# Patient Record
Sex: Female | Born: 1945 | Race: Black or African American | Hispanic: No | Marital: Married | Smoking: Never smoker
Health system: Southern US, Community
[De-identification: ages and names within clinical notes are randomized; demographics above are authoritative.]

## PROBLEM LIST (undated history)

## (undated) DIAGNOSIS — E119 Type 2 diabetes mellitus without complications: Secondary | ICD-10-CM

## (undated) DIAGNOSIS — I10 Essential (primary) hypertension: Secondary | ICD-10-CM

---

## 2018-04-29 ENCOUNTER — Emergency Department: Payer: Medicare HMO

## 2018-04-29 ENCOUNTER — Other Ambulatory Visit: Payer: Self-pay

## 2018-04-29 ENCOUNTER — Encounter: Payer: Self-pay | Admitting: Emergency Medicine

## 2018-04-29 ENCOUNTER — Emergency Department
Admission: EM | Admit: 2018-04-29 | Discharge: 2018-04-29 | Disposition: A | Discharge status: Home / Self Care | Payer: Medicare HMO | Attending: Emergency Medicine | Admitting: Emergency Medicine

## 2018-04-29 DIAGNOSIS — E119 Type 2 diabetes mellitus without complications: Secondary | ICD-10-CM | POA: Diagnosis not present

## 2018-04-29 DIAGNOSIS — R42 Dizziness and giddiness: Secondary | ICD-10-CM | POA: Diagnosis present

## 2018-04-29 DIAGNOSIS — I10 Essential (primary) hypertension: Secondary | ICD-10-CM | POA: Diagnosis not present

## 2018-04-29 HISTORY — DX: Type 2 diabetes mellitus without complications: E11.9

## 2018-04-29 HISTORY — DX: Essential (primary) hypertension: I10

## 2018-04-29 LAB — CBC
HEMATOCRIT: 34.7 % — AB (ref 36.0–46.0)
HEMOGLOBIN: 10.3 g/dL — AB (ref 12.0–15.0)
MCH: 23 pg — ABNORMAL LOW (ref 26.0–34.0)
MCHC: 29.7 g/dL — AB (ref 30.0–36.0)
MCV: 77.5 fL — ABNORMAL LOW (ref 80.0–100.0)
NRBC: 0 % (ref 0.0–0.2)
Platelets: 255 10*3/uL (ref 150–400)
RBC: 4.48 MIL/uL (ref 3.87–5.11)
RDW: 13.6 % (ref 11.5–15.5)
WBC: 8.2 10*3/uL (ref 4.0–10.5)

## 2018-04-29 LAB — URINALYSIS, COMPLETE (UACMP) WITH MICROSCOPIC
BACTERIA UA: NONE SEEN
BILIRUBIN URINE: NEGATIVE
HGB URINE DIPSTICK: NEGATIVE
Ketones, ur: NEGATIVE mg/dL
Nitrite: NEGATIVE
Protein, ur: 100 mg/dL — AB
SPECIFIC GRAVITY, URINE: 1.016 (ref 1.005–1.030)
pH: 6 (ref 5.0–8.0)

## 2018-04-29 LAB — BASIC METABOLIC PANEL
Anion gap: 8 (ref 5–15)
BUN: 21 mg/dL (ref 8–23)
CHLORIDE: 107 mmol/L (ref 98–111)
CO2: 26 mmol/L (ref 22–32)
Calcium: 9.5 mg/dL (ref 8.9–10.3)
Creatinine, Ser: 0.97 mg/dL (ref 0.44–1.00)
GFR calc Af Amer: >60 mL/min (ref >60)
GFR, EST NON AFRICAN AMERICAN: 58 mL/min — AB (ref >60)
Glucose, Bld: 277 mg/dL — ABNORMAL HIGH (ref 70–99)
POTASSIUM: 3.8 mmol/L (ref 3.5–5.1)
SODIUM: 141 mmol/L (ref 135–145)

## 2018-04-29 LAB — BRAIN NATRIURETIC PEPTIDE: B Natriuretic Peptide: 89 pg/mL (ref 0.0–100.0)

## 2018-04-29 LAB — TROPONIN I
Troponin I: <0.03 ng/mL (ref <0.03)
Troponin I: <0.03 ng/mL (ref <0.03)

## 2018-04-29 MED ORDER — ACETAMINOPHEN 500 MG PO TABS
1000.0000 mg | ORAL_TABLET | Freq: Once | ORAL | Status: AC
  Administered 2018-04-29: 1000 mg via ORAL

## 2018-04-29 MED ORDER — ACETAMINOPHEN 500 MG PO TABS
ORAL_TABLET | ORAL | Status: AC
  Administered 2018-04-29: 1000 mg via ORAL
  Filled 2018-04-29: qty 2

## 2018-04-29 NOTE — ED Provider Notes (Signed)
-----------------------------------------   5:45 PM on 04/29/2018 -----------------------------------------   Blood pressure (!) 162/88, pulse 63, temperature 98.3 F (36.8 C), temperature source Oral, resp. rate (!) 22, height 5\' 4"  (1.626 m), weight 95.3 kg, SpO2 99 %.  Assuming care from Dr. Manfred Shirts of [facility] Parrillo is a 72 y.o. female with a chief complaint of Dizziness and Hypertension .    Please refer to H&P by previous MD for further details.  The current plan of care is to f/u repeat troponin which is negative. Patient is asymptomatic and feels back to baseline. She is extremely well appearing. BP trending down. Dicussed return precautions and close follow-up.  Will discharge home per plan the left by Dr. Alphonzo Lemmings.        Nita Sickle, MD 04/29/18 [phone]

## 2018-04-29 NOTE — ED Triage Notes (Signed)
[place] ed [facility]. Pty originally thought she was having vertigo spell while at church. The pt stated she became really dizzy and light headed. No SOB or chest pain. Staff at the church checked pt's bp and reported to ems that it was 220/160. EMS gave 2inch nitro paste eft chest. Brought bp down to 191/86 now. Sugar 310. Hx of diabetes. . 20G LAC placed by EMS. Pt denies LOC or hitting head.

## 2018-04-29 NOTE — ED Notes (Signed)
Diet ordered, MD aware. This RN called dietary to place food order. Dietary informed this RN that tray would be sent to ER.

## 2018-04-29 NOTE — ED Provider Notes (Signed)
[facility] Emergency Department Provider Note  ____________________________________________   I have reviewed the triage vital signs and the nursing notes. Where available I have reviewed prior notes and, if possible and indicated, outside hospital notes.    HISTORY  Chief Complaint Dizziness and Hypertension    HPI Emma Barnett is a 72 y.o. female  Who presents today complaining of having felt lightheaded at church.  Does have a history of vertigo and hypertension, did take her medications prior to going to church.  States that she was lightheaded but did not fall or pass out.  She states that she had her blood pressure checked by someone in the church and she is not sure if they are good at checking blood pressures but they got a very high number, 260, she must got there her blood pressure was were in the 190s so is unclear if this is accurate.  They gave her some nitro and her blood pressure is now in the 170s.  Patient denies any chest pain or shortness of breath, she does states she felt very lightheaded.  She does have a history of hypertension she states she is compliant with her medications she denies any significant stress, she denies any other complaints.  Past Medical History:  Diagnosis Date  . Diabetes mellitus without complication (HCC)   . Hypertension     There are no active problems to display for this patient.   History reviewed. No pertinent surgical history.  Prior to Admission medications   Not on File    Allergies Patient has no known allergies.  History reviewed. No pertinent family history.  Social History Social History   Tobacco Use  . Smoking status: Never Smoker  . Smokeless tobacco: Never Used  Substance Use Topics  . Alcohol use: Not Currently  . Drug use: Never    Review of Systems Constitutional: No fever/chills Eyes: No visual changes. ENT: No sore throat. No stiff neck no neck pain Cardiovascular: Denies  chest pain. Respiratory: Denies shortness of breath. Gastrointestinal:   no vomiting.  No diarrhea.  No constipation. Genitourinary: Negative for dysuria. Musculoskeletal: Negative lower extremity swelling Skin: Negative for rash. Neurological: Negative for severe headaches, focal weakness or numbness.   ____________________________________________   PHYSICAL EXAM:  VITAL SIGNS: ED Triage Vitals  Enc Vitals Group     BP 04/29/18 1410 (!) 173/80     Pulse Rate 04/29/18 1410 76     Resp 04/29/18 1410 (!) 28     Temp 04/29/18 1403 98.3 F (36.8 C)     Temp Source 04/29/18 1403 Oral     SpO2 04/29/18 1410 100 %     Weight 04/29/18 1404 210 lb (95.3 kg)     Height 04/29/18 1404 5\' 4"  (1.626 m)     Head Circumference --      Peak Flow --      Pain Score 04/29/18 1404 0     Pain Loc --      Pain Edu? --      Excl. in GC? --     Constitutional: Alert and oriented. Well appearing and in no acute distress. Eyes: Conjunctivae are normal Head: Atraumatic HEENT: No congestion/rhinnorhea. Mucous membranes are moist.  Oropharynx non-erythematous Neck:   Nontender with no meningismus, no masses, no stridor Cardiovascular: Normal rate, regular rhythm. Grossly normal heart sounds.  Good peripheral circulation. Respiratory: Normal respiratory effort.  No retractions. Lungs CTAB. Abdominal: Soft and nontender. No distention. No guarding no rebound  Back:  There is no focal tenderness or step off.  there is no midline tenderness there are no lesions noted. there is no CVA tenderness Musculoskeletal: No lower extremity tenderness, no upper extremity tenderness. No joint effusions, no DVT signs strong distal pulses no edema Neurologic:  Normal speech and language. No gross focal neurologic deficits are appreciated.  Skin:  Skin is warm, dry and intact. No rash noted. Psychiatric: Mood and affect are normal. Speech and behavior are normal.  ____________________________________________    LABS (all labs ordered are listed, but only abnormal results are displayed)  Labs Reviewed  BASIC METABOLIC PANEL - Abnormal; Notable for the following components:      Result Value   Glucose, Bld 277 (*)    GFR calc non Af Amer 58 (*)    All other components within normal limits  CBC - Abnormal; Notable for the following components:   Hemoglobin 10.3 (*)    HCT 34.7 (*)    MCV 77.5 (*)    MCH 23.0 (*)    MCHC 29.7 (*)    All other components within normal limits  URINALYSIS, COMPLETE (UACMP) WITH MICROSCOPIC - Abnormal; Notable for the following components:   Color, Urine YELLOW (*)    APPearance CLEAR (*)    Glucose, UA >=500 (*)    Protein, ur 100 (*)    Leukocytes, UA TRACE (*)    All other components within normal limits  BRAIN NATRIURETIC PEPTIDE  CBG MONITORING, ED    Pertinent labs  results that were available during my care of the patient were reviewed by me and considered in my medical decision making (see chart for details). ____________________________________________  EKG  I personally interpreted any EKGs ordered by me or triage  ____________________________________________  RADIOLOGY  Pertinent labs & imaging results that were available during my care of the patient were reviewed by me and considered in my medical decision making (see chart for details). If possible, patient and/or family made aware of any abnormal findings.  Dg Chest 2 View  Result Date: 04/29/2018 CLINICAL DATA:  Patient reports feeling dizzy and lightheaded today while at church. Denies SOB, CP, cough or fever. Hx HTN, DM. Non-smoker. EXAM: CHEST - 2 VIEW COMPARISON:  None. FINDINGS: Shallow lung inflation accentuates the cardiac size. The aorta is mildly tortuous. There are no focal consolidations or pleural effusions. No pulmonary edema. Degenerative changes are seen in thoracic spine. IMPRESSION: Shallow inflation. No evidence for acute cardiopulmonary abnormality. Electronically  Signed   By: Norva Pavlov M.D.   On: 04/29/2018 14:54   Ct Head Wo Contrast  Result Date: 04/29/2018 CLINICAL DATA:  Per RN note in pt chart "Pt presents to ed via [facility] from church. Pty originally thought she was having vertigo spell while at church. The pt stated she became really dizzy and light headed. No SOB or chest pain. Staff at the church checked pt's bp and reported to ems that it was 220/160. EMS gave 2inch nitro paste eft chest. Brought bp down to 191/86 now. Sugar 310. Hx of diabetes. EXAM: CT HEAD WITHOUT CONTRAST TECHNIQUE: Contiguous axial images were obtained from the base of the skull through the vertex without intravenous contrast. COMPARISON:  None. FINDINGS: Brain: No evidence of acute infarction, hemorrhage, hydrocephalus, extra-axial collection or mass lesion/mass effect. Mild reticular enlargement consistent with age appropriate volume loss. There is periventricular white matter hypoattenuation consistent with mild chronic microvascular ischemic change. Vascular: No hyperdense vessel or unexpected calcification. Skull: Normal. Negative for  fracture or focal lesion. Sinuses/Orbits: Visualized globes and orbits are unremarkable. The visualized sinuses and mastoid air cells are clear. Other: None. IMPRESSION: 1. No acute intracranial abnormalities. 2. Age related volume loss. Mild chronic microvascular ischemic change. Electronically Signed   By: Amie Portland M.D.   On: 04/29/2018 14:43   ____________________________________________    PROCEDURES  Procedure(s) performed: None  Procedures  Critical Care performed: None  ____________________________________________   INITIAL IMPRESSION / ASSESSMENT AND PLAN / ED COURSE  Pertinent labs & imaging results that were available during my care of the patient were reviewed by me and considered in my medical decision making (see chart for details).  She is absolutely asymptomatic at this time, did have a near syncopal event  at church, after standing up quickly, blood pressure was reported to be significantly elevated but is reassuring at this time, she does not have any chest pain or shortness of breath she does not endorse any exertional symptoms or palpitations, we are reassured by her findings thus far, we will observe her and recent cardiac enzymes and see if any other interventions are required at this time.  No evidence of hypertensive urgency no headache no chest pain etc. Signed out to dr. Don Perking at the end of my shift.    ____________________________________________   FINAL CLINICAL IMPRESSION(S) / ED DIAGNOSES  Final diagnoses:  None      This chart was dictated using voice recognition software.  Despite best efforts to proofread,  errors can occur which can change meaning.      Jeanmarie Plant, MD 04/29/18 [phone]

## 2018-04-29 NOTE — ED Notes (Signed)
Patient transported to CT 

## 2018-04-29 NOTE — ED Notes (Signed)
Pt assisted to bedside commode. Pt denies any dizziness or light headedness at this time.

## 2019-11-20 IMAGING — CT CT HEAD W/O CM
4 series · 16 of 47 positions shown, 18 images · non-contrast
Comparison: None.

CLINICAL DATA: Per RN note in pt chart "Pt presents to ed via [facility]
from church. Pty originally thought she was having vertigo Alexa
while at church. The pt stated she became really dizzy and light
headed. No SOB or chest pain. Staff at the church checked pt's bp
and reported to ems that it was 220/160. EMS gave 5inch nitro paste
eft chest. Brought bp down to 191/86 now. Sugar 310. Hx of diabetes.

EXAM:
CT HEAD WITHOUT CONTRAST
TECHNIQUE: Contiguous axial images were obtained from the base of the skull
through the vertex without intravenous contrast.

[Series 2: head wo · axial · 0.47mm/px · z∈[-98,-8]mm · 7 of 26 slices shown, 9 images]
[im 4/26  brain]
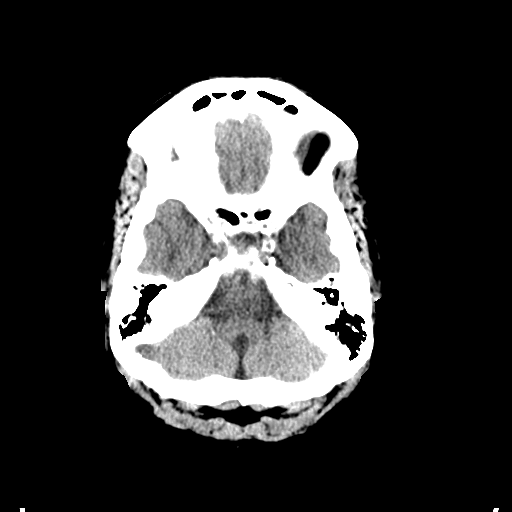
[im 4/26  bone]
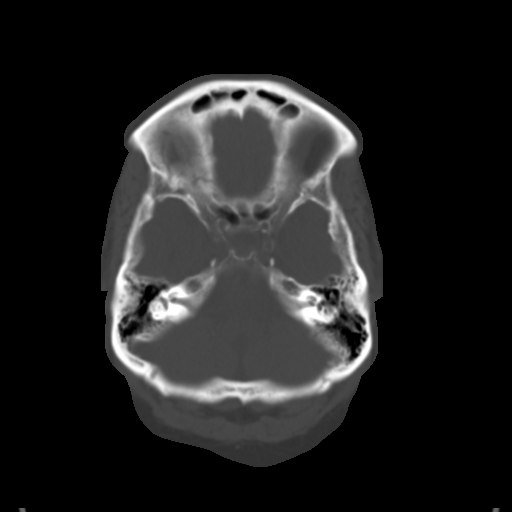
[im 7/26  brain]
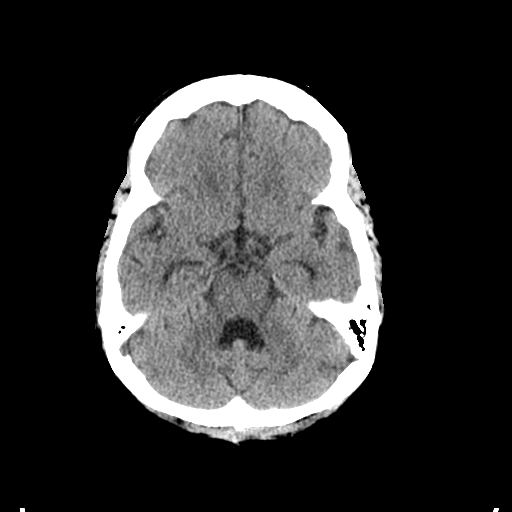
[im 10/26  brain]
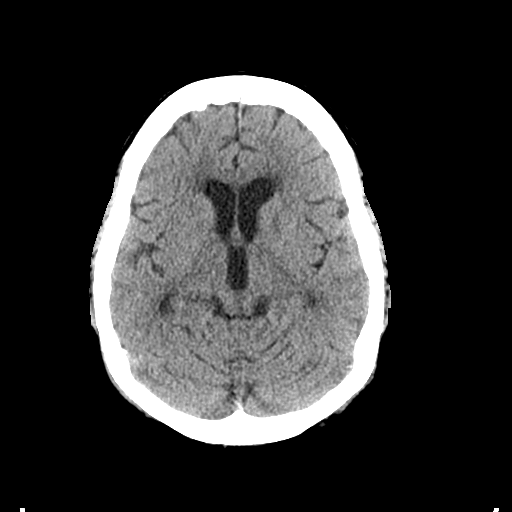
[im 13/26  brain]
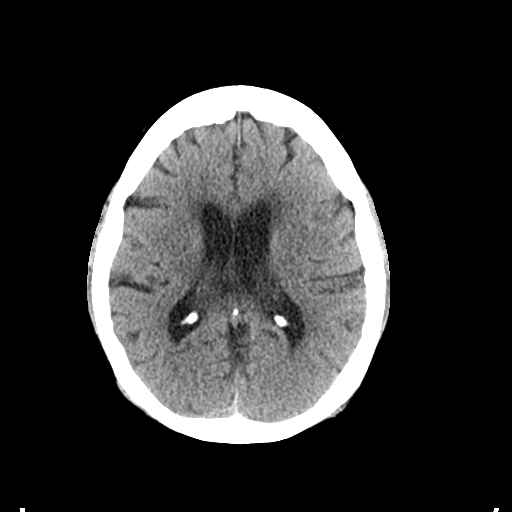
[im 16/26  brain]
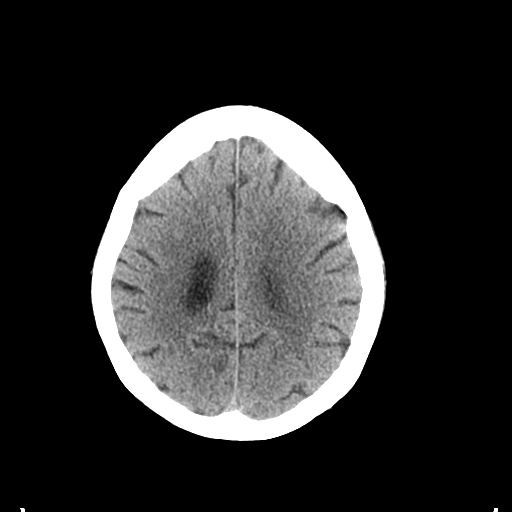
[im 16/26  bone]
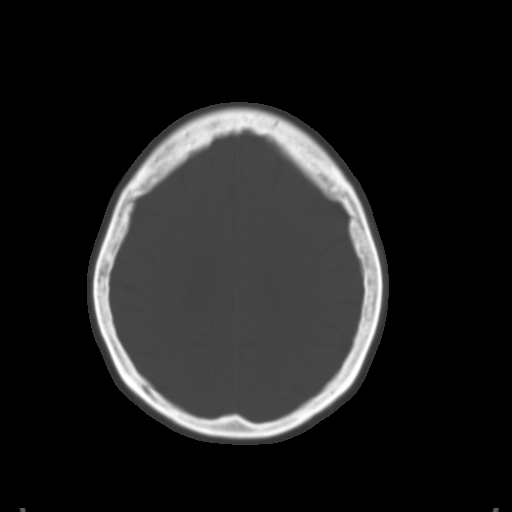
[im 19/26  brain]
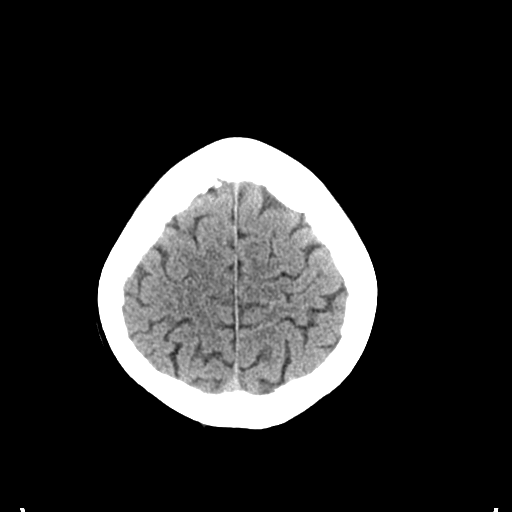
[im 22/26  brain]
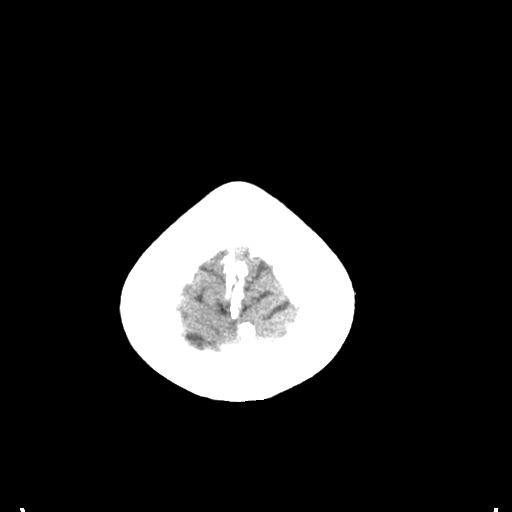

[Series 3: head bone · axial · 0.47mm/px · z∈[-101,-75]mm · 3 of 65 slices shown]
[im 7/65  bone]
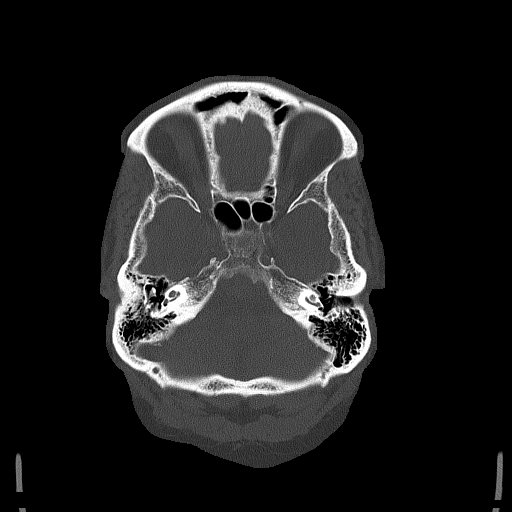
[im 13/65  bone]
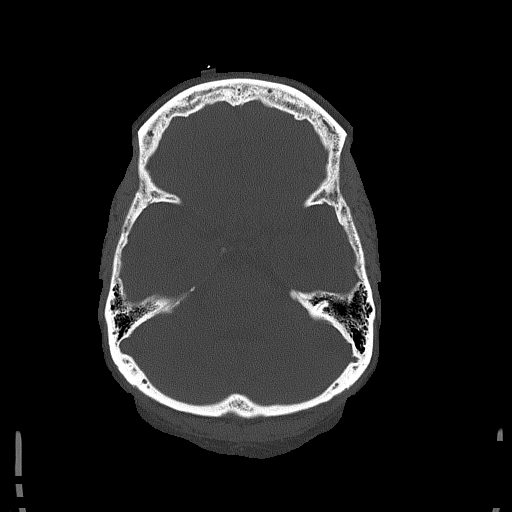
[im 20/65  bone]
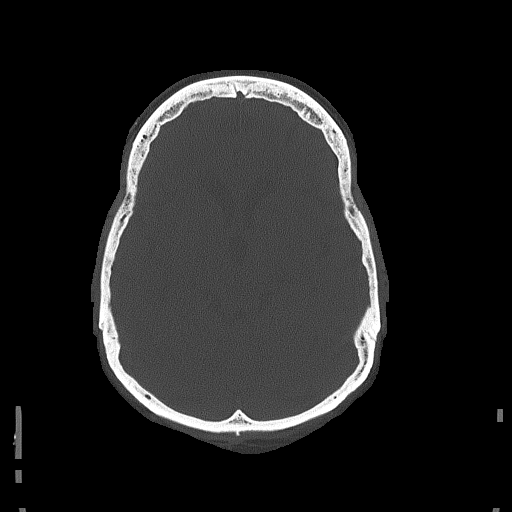

[Series 4: coronal soft tissue · coronal · 0.26mm/px · 3 of 62 slices shown]
[im 21/62  brain]
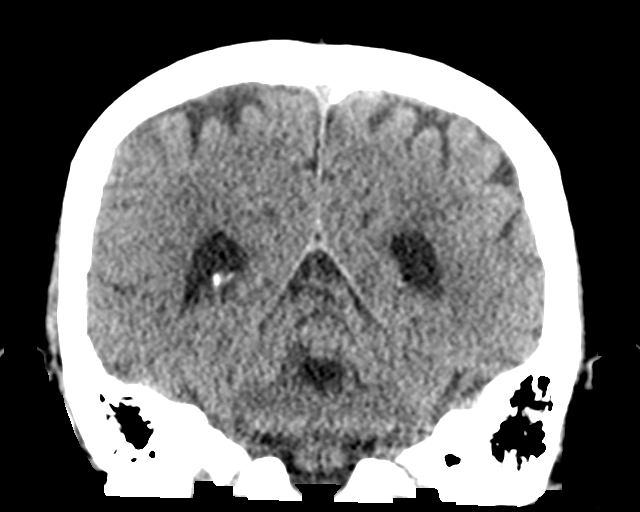
[im 28/62  brain]
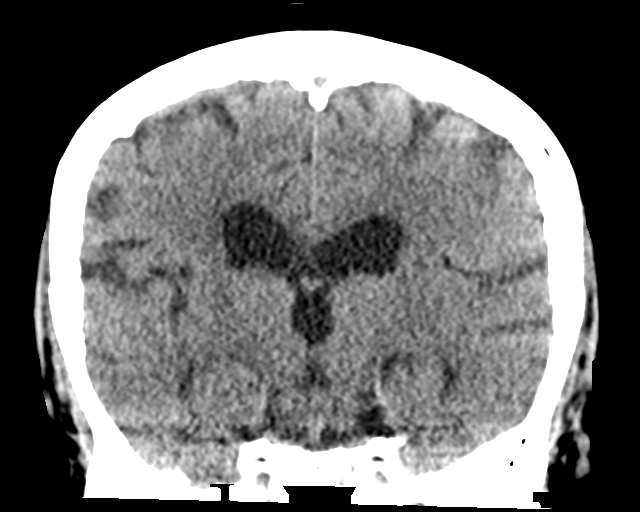
[im 34/62  brain]
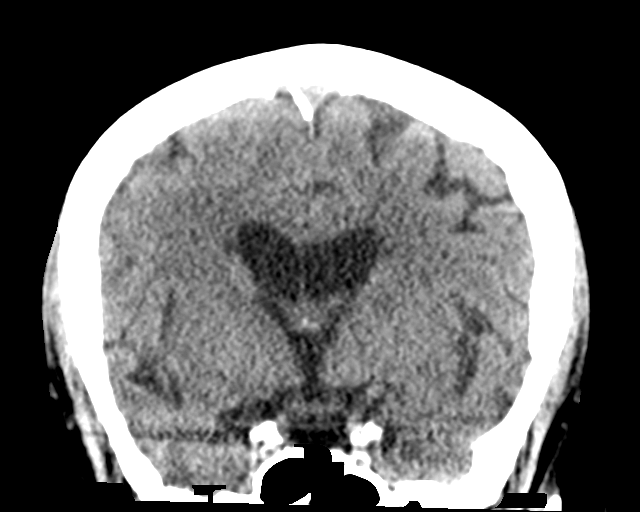

[Series 5: sagittal soft tissue · sagittal · 0.27mm/px · 3 of 54 slices shown]
[im 18/54  brain]
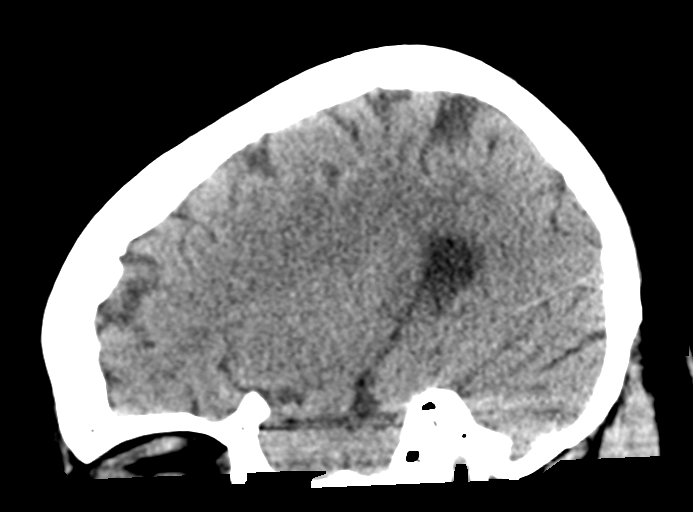
[im 27/54  brain]
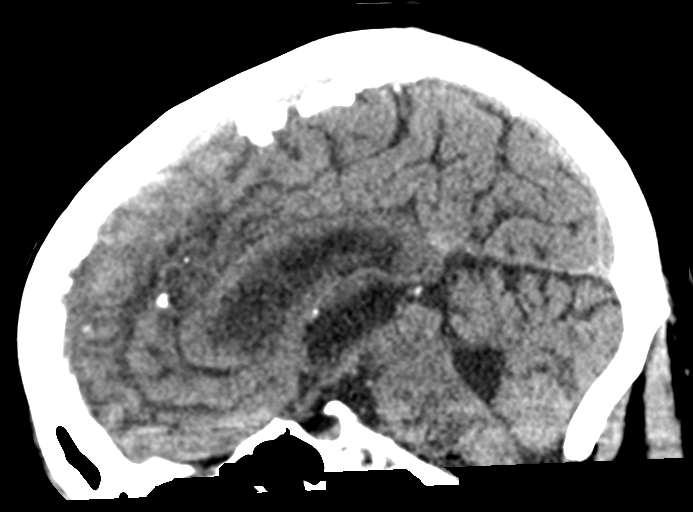
[im 36/54  brain]
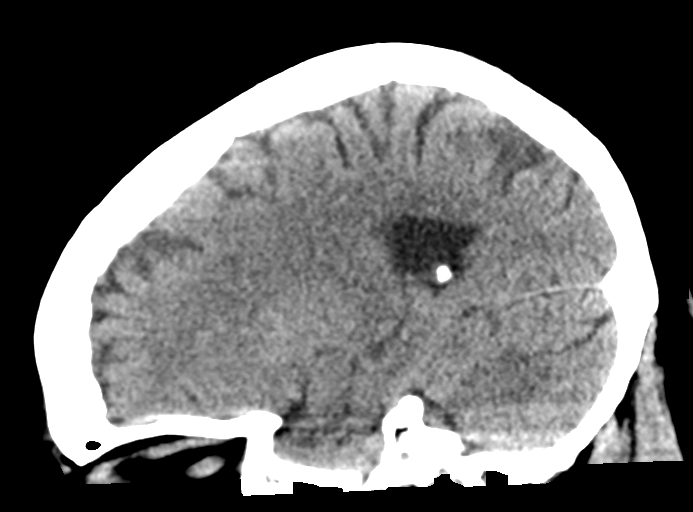

[16 of 47 positions shown; findings below may reference images not displayed]

FINDINGS: Brain: No evidence of acute infarction, hemorrhage, hydrocephalus,
extra-axial collection or mass lesion/mass effect.

Mild reticular enlargement consistent with age appropriate volume
loss. There is periventricular white matter hypoattenuation
consistent with mild chronic microvascular ischemic change.

Vascular: No hyperdense vessel or unexpected calcification.

Skull: Normal. Negative for fracture or focal lesion.

Sinuses/Orbits: Visualized globes and orbits are unremarkable. The
visualized sinuses and mastoid air cells are clear.

Other: None.
IMPRESSION: 1. No acute intracranial abnormalities.
2. Age related volume loss. Mild chronic microvascular ischemic
change.

## 2019-11-20 IMAGING — CR DG CHEST 2V
1 series · 2 of 2 positions shown · non-contrast
Comparison: None.

CLINICAL DATA: Patient reports feeling dizzy and lightheaded today
while at church. Denies SOB, CP, cough or fever. Hx HTN, DM.
Non-smoker.

EXAM:
CHEST - 2 VIEW

[Series 1: dg chest 2 view · 0.14mm/px · 2 of 2 slices shown]
[im 1/2]
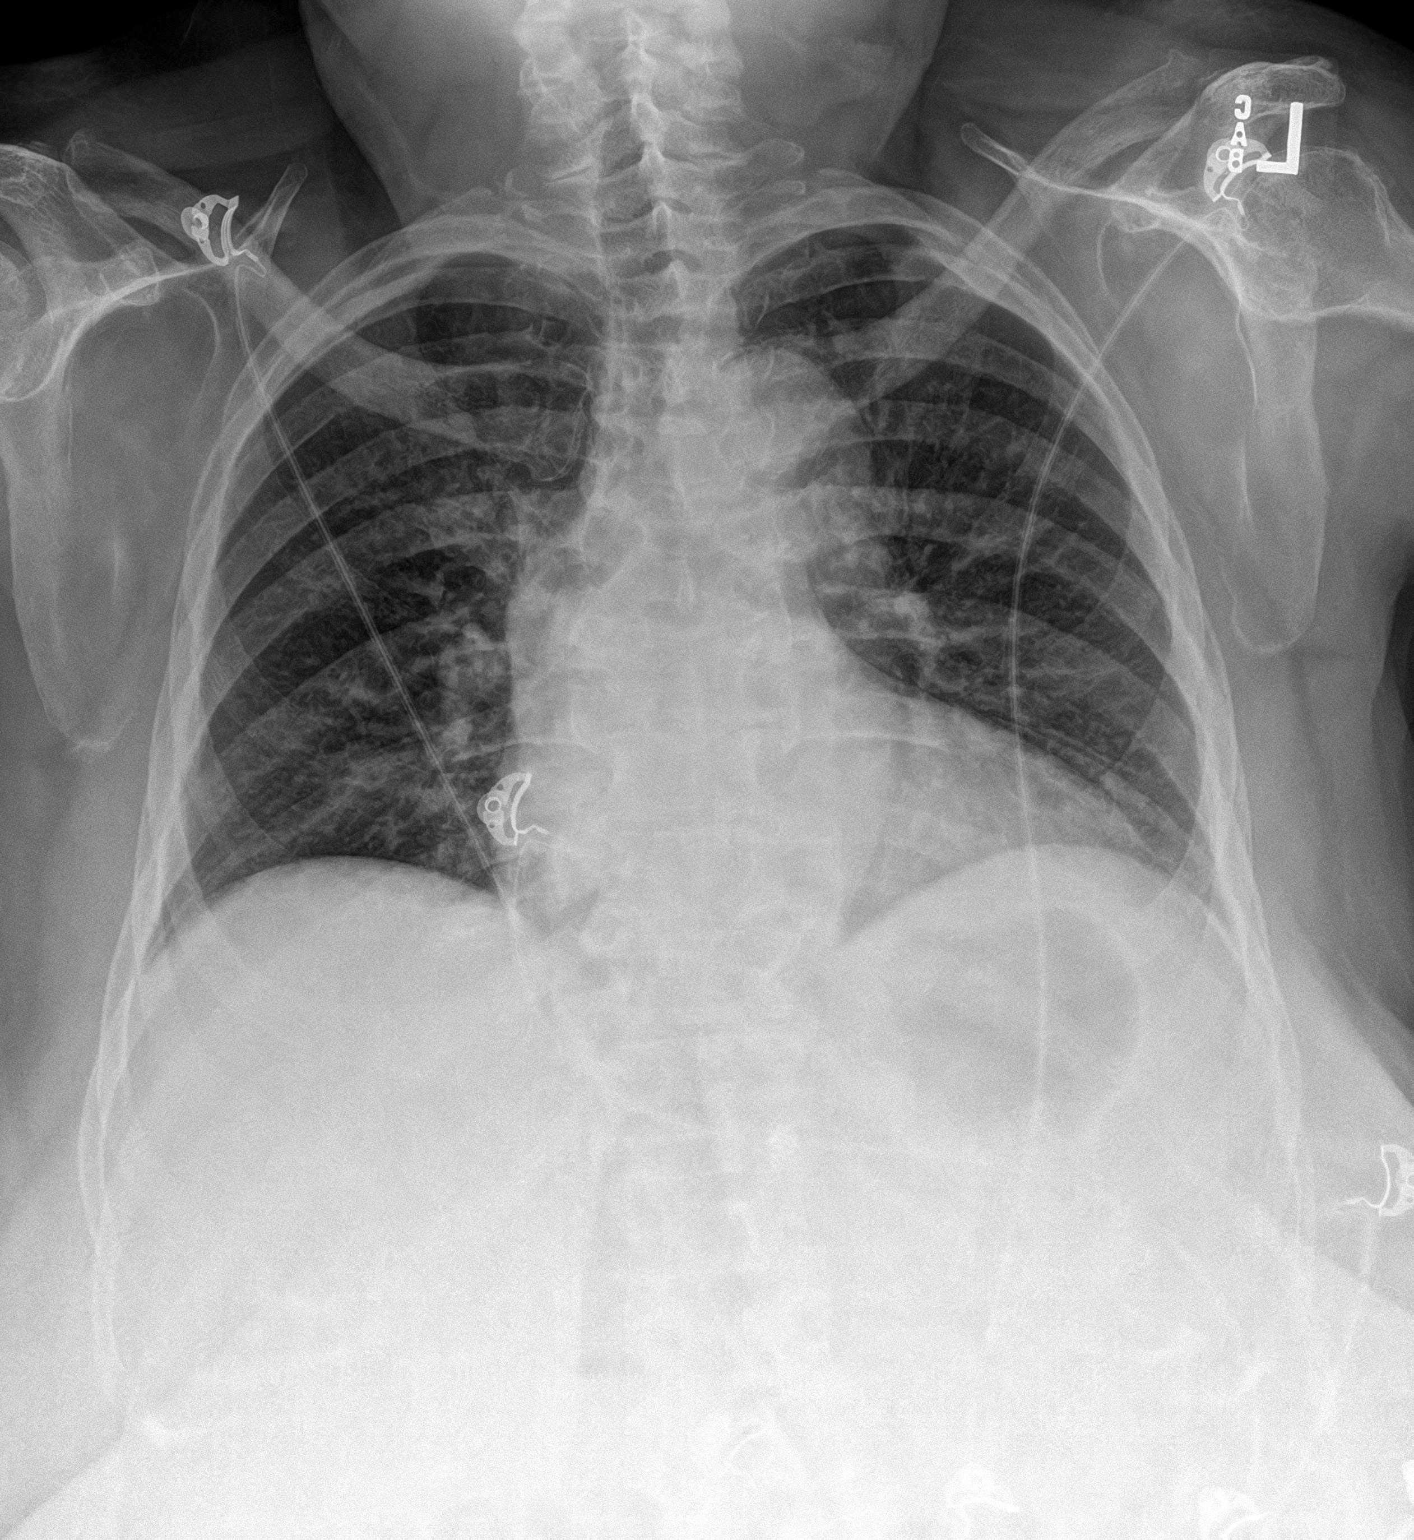
[im 2/2]
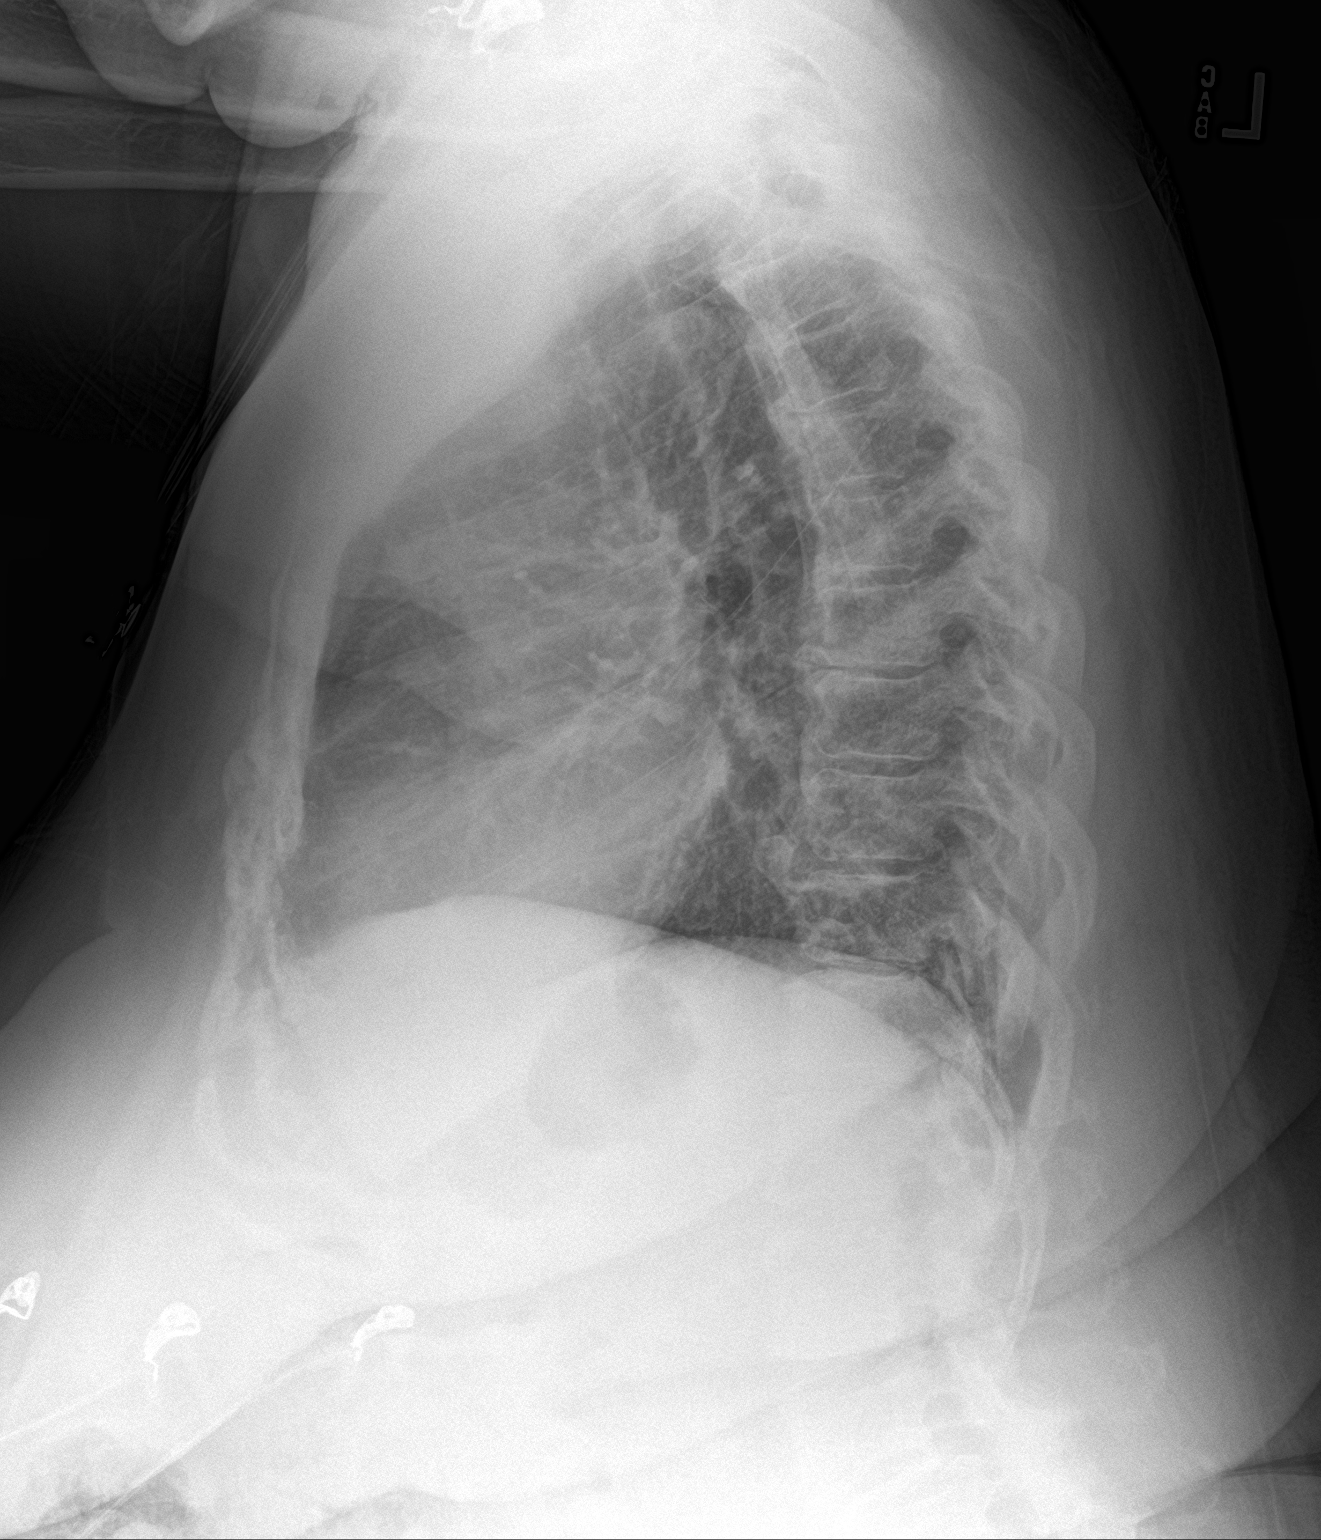

[2 of 2 positions shown; findings below may reference images not displayed]

FINDINGS: Shallow lung inflation accentuates the cardiac size. The aorta is
mildly tortuous. There are no focal consolidations or pleural
effusions. No pulmonary edema. Degenerative changes are seen in
thoracic spine.
IMPRESSION: Shallow inflation. No evidence for acute cardiopulmonary
abnormality.
